# Patient Record
Sex: Male | Born: 1995 | Race: White | Hispanic: No | Marital: Single | Smoking: Never smoker
Health system: Southern US, Community
[De-identification: ages and names within clinical notes are randomized; demographics above are authoritative.]

## PROBLEM LIST (undated history)

## (undated) DIAGNOSIS — J45909 Unspecified asthma, uncomplicated: Secondary | ICD-10-CM

## (undated) HISTORY — DX: Unspecified asthma, uncomplicated: J45.909

## (undated) HISTORY — PX: NO PAST SURGERIES: SHX2092

---
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
## 2017-09-20 ENCOUNTER — Other Ambulatory Visit: Payer: Self-pay

## 2017-09-20 ENCOUNTER — Emergency Department
Admission: EM | Admit: 2017-09-20 | Discharge: 2017-09-20 | Disposition: A | Discharge status: Home / Self Care | Payer: Managed Care, Other (non HMO) | Attending: Emergency Medicine | Admitting: Emergency Medicine

## 2017-09-20 ENCOUNTER — Encounter: Payer: Self-pay | Admitting: Emergency Medicine

## 2017-09-20 DIAGNOSIS — T7840XA Allergy, unspecified, initial encounter: Secondary | ICD-10-CM | POA: Diagnosis not present

## 2017-09-20 DIAGNOSIS — Z91010 Allergy to peanuts: Secondary | ICD-10-CM | POA: Insufficient documentation

## 2017-09-20 DIAGNOSIS — Z91018 Allergy to other foods: Secondary | ICD-10-CM

## 2017-09-20 DIAGNOSIS — R0989 Other specified symptoms and signs involving the circulatory and respiratory systems: Secondary | ICD-10-CM | POA: Diagnosis present

## 2017-09-20 MED ORDER — PREDNISONE 20 MG PO TABS: 40.0000 mg | ORAL_TABLET | Freq: Every day | ORAL | 0 refills | Status: DC

## 2017-09-20 MED ORDER — PREDNISONE 20 MG PO TABS
40.0000 mg | ORAL_TABLET | Freq: Once | ORAL | Status: AC
  Administered 2017-09-20: 40 mg via ORAL
  Filled 2017-09-20: qty 2

## 2017-09-20 MED ORDER — FAMOTIDINE 20 MG PO TABS
20.0000 mg | ORAL_TABLET | Freq: Once | ORAL | Status: AC
  Administered 2017-09-20: 20 mg via ORAL
  Filled 2017-09-20: qty 1

## 2017-09-20 MED ORDER — DIPHENHYDRAMINE HCL 25 MG PO CAPS
25.0000 mg | ORAL_CAPSULE | Freq: Once | ORAL | Status: AC
  Administered 2017-09-20: 25 mg via ORAL
  Filled 2017-09-20: qty 1

## 2017-09-20 MED ORDER — EPINEPHRINE 0.3 MG/0.3ML IJ SOAJ
0.3000 mg | Freq: Once | INTRAMUSCULAR | 0 refills | Status: AC
Start: 2017-09-20 — End: 2017-09-20

## 2017-09-20 NOTE — ED Triage Notes (Signed)
Patient has history of peanut allergy.  About one hour ago, patient ate banana pudding with macadamia nuts.  C/O feeling like throat has cotton in it..  25 mg Benadryl taken PO.  AAOx3.  Skin warm and dry.  NAD.

## 2017-09-20 NOTE — Discharge Instructions (Addendum)

## 2017-09-20 NOTE — ED Provider Notes (Signed)
[facility] Emergency Department Provider Note   ____________________________________________   First MD Initiated Contact with Patient 09/20/17 1414     (approximate)  I have reviewed the triage vital signs and the nursing notes.   HISTORY  Chief Complaint Allergic Reaction   HPI Jared Sims is a 22 y.o. male reports no major medical history is a history of asthma and not in tree not allergy  Patient was eating pudding today, but 15 minutes after eating he began to experience a little bit of a "cotton ball feeling in the back of his throat and learned that the putting actually had macadamia nuts in it.  He then felt little nauseated after taking the trash out but those symptoms have gone away except he had a slight feeling cottonball or slight swelling feeling in the back of his throat.  He went to the pharmacy, got a Benadryl and took 125 mg tablet.  He reports he still having just a slight sensation in the back of his throat but is feeling overall better.  No rash no itching.  No trouble breathing.  No trouble swallowing, just feels like a little bit of "cottony" feeling.  No nausea vomiting.  No fevers or chills.  Had some nausea immediately after around 02/25/1928, but is feeling better now.  Last time he an allergy reaction he was many years ago, does not currently have EpiPen prescription.  History reviewed. No pertinent past medical history.  There are no active problems to display for this patient.   History reviewed. No pertinent surgical history.  Prior to Admission medications   Medication Sig Start Date End Date Taking? Authorizing Provider  EPINEPHrine 0.3 mg/0.3 mL IJ SOAJ injection Inject 0.3 mLs (0.3 mg total) into the muscle once for 1 dose. 09/20/17 09/20/17  Sharyn Creamer, MD  predniSONE (DELTASONE) 20 MG tablet Take 2 tablets (40 mg total) by mouth daily with breakfast. 09/20/17   Sharyn Creamer, MD    Allergies Peanut-containing  drug products  No family history on file.  Social History Social History   Tobacco Use  . Smoking status: Never Smoker  . Smokeless tobacco: Never Used  Substance Use Topics  . Alcohol use: Not on file  . Drug use: Not on file    Review of Systems Constitutional: No fever/chills Eyes: No visual changes. ENT: No sore throat.  See HPI Cardiovascular: Denies chest pain. Respiratory: Denies shortness of breath. Gastrointestinal: No abdominal pain.   Skin: Negative for rash. Neurological: Negative for headaches.    ____________________________________________   PHYSICAL EXAM:  VITAL SIGNS: ED Triage Vitals  Enc Vitals Group     BP 09/20/17 1409 135/62     Pulse Rate 09/20/17 1409 (!) 51     Resp 09/20/17 1409 18     Temp 09/20/17 1409 98 F (36.7 C)     Temp Source 09/20/17 1409 Oral     SpO2 09/20/17 1409 100 %     Weight 09/20/17 1405 160 lb (72.6 kg)     Height 09/20/17 1405 5\' 10"  (1.778 m)     Head Circumference --      Peak Flow --      Pain Score 09/20/17 1405 0     Pain Loc --      Pain Edu? --      Excl. in GC? --     Constitutional: Alert and oriented. Well appearing and in no acute distress. Eyes: Conjunctivae are normal. Head: Atraumatic.  Nose: No congestion/rhinnorhea. Mouth/Throat: Mucous membranes are moist. Neck: No stridor.  The visualizable oropharynx is widely patent.  No edema.  Normal tonsils.  No edema, no elevation of the tongue.  Anterior neck is normal.  Speaks with full clear sentences.  Swallowing normally. Cardiovascular: Normal rate, regular rhythm. Grossly normal heart sounds.  Good peripheral circulation. Respiratory: Normal respiratory effort.  No retractions. Lungs CTAB. Gastrointestinal: Soft and nontender. No distention. Musculoskeletal: No lower extremity tenderness nor edema. Neurologic:  Normal speech and language. No gross focal neurologic deficits are appreciated.  Skin:  Skin is warm, dry and intact. No rash  noted. Psychiatric: Mood and affect are normal. Speech and behavior are normal.  ____________________________________________   LABS (all labs ordered are listed, but only abnormal results are displayed)  Labs Reviewed - No data to display ____________________________________________  EKG   ____________________________________________  RADIOLOGY   ____________________________________________   PROCEDURES  Procedure(s) performed: None  Procedures  Critical Care performed: No  ____________________________________________   INITIAL IMPRESSION / ASSESSMENT AND PLAN / ED COURSE  Pertinent labs & imaging results that were available during my care of the patient were reviewed by me and considered in my medical decision making (see chart for details).  Exposure to not with a known not allergy.  Currently appears quite well without any evidence of angioedema but does report a slight cottony feeling in the back of the throat.  Speaks with clear normal voice, and took Benadryl which I suspect is helped some already.  No evidence of anaphylaxis to noted, no angioedema denoted.  Will observe the patient and treat with additional Benadryl and Pepcid, also prednisone.  Discussed with patient anticipate discharging with prescription for EpiPen after observation of continue to do so well.    ----------------------------------------- 3:12 PM on 09/20/2017 -----------------------------------------  Patient reports he is feeling improved, no symptoms at present.  Resting comfortably.  Will discharge to home, provided teaching on return precautions and epinephrine pen usage patient.  Mother will be driving him home.  Return precautions and treatment recommendations and follow-up discussed with the patient who is agreeable with the plan.   ____________________________________________   FINAL CLINICAL IMPRESSION(S) / ED DIAGNOSES  Final diagnoses:  Allergic reaction, initial  encounter  Tree nut allergy      NEW MEDICATIONS STARTED DURING THIS VISIT:  New Prescriptions   EPINEPHRINE 0.3 MG/0.3 ML IJ SOAJ INJECTION    Inject 0.3 mLs (0.3 mg total) into the muscle once for 1 dose.   PREDNISONE (DELTASONE) 20 MG TABLET    Take 2 tablets (40 mg total) by mouth daily with breakfast.     Note:  This document was prepared using Dragon voice recognition software and may include unintentional dictation errors.     Sharyn CreamerQuale, Olinda Nola, MD 09/20/17 1515

## 2018-10-27 ENCOUNTER — Ambulatory Visit (INDEPENDENT_AMBULATORY_CARE_PROVIDER_SITE_OTHER): Payer: Managed Care, Other (non HMO) | Admitting: Family Medicine

## 2018-10-27 ENCOUNTER — Other Ambulatory Visit: Payer: Self-pay

## 2018-10-27 ENCOUNTER — Encounter: Payer: Self-pay | Admitting: Family Medicine

## 2018-10-27 VITALS — BP 98/60 | HR 97 | Temp 98.0°F | Ht 69.25 in | Wt 152.8 lb

## 2018-10-27 DIAGNOSIS — Z0000 Encounter for general adult medical examination without abnormal findings: Secondary | ICD-10-CM

## 2018-10-27 DIAGNOSIS — Z91010 Allergy to peanuts: Secondary | ICD-10-CM

## 2018-10-27 MED ORDER — EPINEPHRINE 0.3 MG/0.3ML IJ SOAJ
0.3000 mg | INTRAMUSCULAR | 1 refills | Status: AC | PRN
Start: 2018-10-27 — End: ?

## 2018-10-27 NOTE — Patient Instructions (Signed)
Good to meet you today  Keep up your healthy habits- exercise, sleep, low stress Add stretching, especially after running  Follow up every 1-2 years for your physical, come in if you need to for illness/injury/etc  Health Maintenance, Male Adopting a healthy lifestyle and getting preventive care are important in promoting health and wellness. Ask your health care provider about:  The right schedule for you to have regular tests and exams.  Things you can do on your own to prevent diseases and keep yourself healthy. What should I know about diet, weight, and exercise? Eat a healthy diet   Eat a diet that includes plenty of vegetables, fruits, low-fat dairy products, and lean protein.  Do not eat a lot of foods that are high in solid fats, added sugars, or sodium. Maintain a healthy weight Body mass index (BMI) is a measurement that can be used to identify possible weight problems. It estimates body fat based on height and weight. Your health care provider can help determine your BMI and help you achieve or maintain a healthy weight. Get regular exercise Get regular exercise. This is one of the most important things you can do for your health. Most adults should:  Exercise for at least 150 minutes each week. The exercise should increase your heart rate and make you sweat (moderate-intensity exercise).  Do strengthening exercises at least twice a week. This is in addition to the moderate-intensity exercise.  Spend less time sitting. Even light physical activity can be beneficial. Watch cholesterol and blood lipids Have your blood tested for lipids and cholesterol at 22 years of age, then have this test every 5 years. You may need to have your cholesterol levels checked more often if:  Your lipid or cholesterol levels are high.  You are older than 22 years of age.  You are at high risk for heart disease. What should I know about cancer screening? Many types of cancers can be  detected early and may often be prevented. Depending on your health history and family history, you may need to have cancer screening at various ages. This may include screening for:  Colorectal cancer.  Prostate cancer.  Skin cancer.  Lung cancer. What should I know about heart disease, diabetes, and high blood pressure? Blood pressure and heart disease  High blood pressure causes heart disease and increases the risk of stroke. This is more likely to develop in people who have high blood pressure readings, are of African descent, or are overweight.  Talk with your health care provider about your target blood pressure readings.  Have your blood pressure checked: ? Every 3-5 years if you are 7818-22 years of age. ? Every year if you are 327 years old or older.  If you are between the ages of 4765 and 1275 and are a current or former smoker, ask your health care provider if you should have a one-time screening for abdominal aortic aneurysm (AAA). Diabetes Have regular diabetes screenings. This checks your fasting blood sugar level. Have the screening done:  Once every three years after age 22 if you are at a normal weight and have a low risk for diabetes.  More often and at a younger age if you are overweight or have a high risk for diabetes. What should I know about preventing infection? Hepatitis B If you have a higher risk for hepatitis B, you should be screened for this virus. Talk with your health care provider to find out if you are at risk  for hepatitis B infection. Hepatitis C Blood testing is recommended for:  Everyone born from [birth year] through 1964.  Anyone with known risk factors for hepatitis C. Sexually transmitted infections (STIs)  You should be screened each year for STIs, including gonorrhea and chlamydia, if: ? You are sexually active and are younger than 22 years of age. ? You are older than 23 years of age and your health care provider tells you that you are at risk  for this type of infection. ? Your sexual activity has changed since you were last screened, and you are at increased risk for chlamydia or gonorrhea. Ask your health care provider if you are at risk.  Ask your health care provider about whether you are at high risk for HIV. Your health care provider may recommend a prescription medicine to help prevent HIV infection. If you choose to take medicine to prevent HIV, you should first get tested for HIV. You should then be tested every 3 months for as long as you are taking the medicine. Follow these instructions at home: Lifestyle  Do not use any products that contain nicotine or tobacco, such as cigarettes, e-cigarettes, and chewing tobacco. If you need help quitting, ask your health care provider.  Do not use street drugs.  Do not share needles.  Ask your health care provider for help if you need support or information about quitting drugs. Alcohol use  Do not drink alcohol if your health care provider tells you not to drink.  If you drink alcohol: ? Limit how much you have to 0-2 drinks a day. ? Be aware of how much alcohol is in your drink. In the [place] one drink equals one 12 oz bottle of beer (355 mL), one 5 oz glass of wine (148 mL), or one 1 oz glass of hard liquor (44 mL). General instructions  Schedule regular health, dental, and eye exams.  Stay current with your vaccines.  Tell your health care provider if: ? You often feel depressed. ? You have ever been abused or do not feel safe at home. Summary  Adopting a healthy lifestyle and getting preventive care are important in promoting health and wellness.  Follow your health care provider's instructions about healthy diet, exercising, and getting tested or screened for diseases.  Follow your health care provider's instructions on monitoring your cholesterol and blood pressure. This information is not intended to replace advice given to you by your health care provider.  Make sure you discuss any questions you have with your health care provider. Document Released: 08/09/2007 Document Revised: 02/03/2018 Document Reviewed: 02/03/2018 Elsevier Patient Education  2020 [employer].

## 2018-10-27 NOTE — Progress Notes (Signed)
Subjective:    Patient ID: Jared Sims, male    DOB: 1995-05-31, 23 y.o.   MRN: [id number]  HPI Chief Complaint  Patient presents with  . New Patient (Initial Visit)    Establish Care   This is a 23 yo male who presents today to establish care. He lives with his parents and two younger sisters. 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 He enjoys hunting, fishing, running.     Last CPE- several years ago Tdap- will check immunization records, ? Had in HS Flu-declines Dental- regular Eye- regular Exercise- runs daily Sex- never sexually active ETOH- never Drugs- never   Past Medical History:  Diagnosis Date  . Childhood asthma    Past Surgical History:  Procedure Laterality Date  . NO PAST SURGERIES     Family History  Problem Relation Age of Onset  . Cancer Maternal Grandmother   . Early death Maternal Grandmother   . Asthma Maternal Grandfather   . Kidney disease Paternal Grandmother   . Early death Paternal Grandfather    Social History   Tobacco Use  . Smoking status: Never Smoker  . Smokeless tobacco: Never Used  Substance Use Topics  . Alcohol use: Never    Frequency: Never  . Drug use: Never      Review of Systems  Constitutional: Negative.   HENT: Negative.   Eyes: Negative.   Respiratory: Negative.   Cardiovascular: Negative.   Gastrointestinal: Negative.   Endocrine: Negative.   Genitourinary: Negative.   Musculoskeletal: Negative.   Skin: Negative.   Allergic/Immunologic: Positive for food allergies (peanut, carries epi pen, avoids triggers).  Neurological: Negative.   Hematological: Negative.   Psychiatric/Behavioral: Negative.        Objective:   Physical Exam Physical Exam  Constitutional: He is oriented to person, place, and time. He appears well-developed and well-nourished.  HENT:  Head: Normocephalic and atraumatic.  Right Ear: External ear normal.  Left Ear: External ear normal.  Nose: Nose normal.    Eyes: Conjunctivae are normal.Neck: Normal range of motion. Neck supple.  Cardiovascular: Normal rate, regular rhythm, normal heart sounds and intact distal pulses.   Pulmonary/Chest: Effort normal and breath sounds normal.  Abdominal: Soft. Bowel sounds are normal. Hernia confirmed negative in the right inguinal area and confirmed negative in the left inguinal area.  Genitourinary: Testes normal and penis normal. Circumcised.  Musculoskeletal: Normal range of motion. He exhibits no edema or tenderness.       Cervical back: Normal.       Thoracic back: Normal.       Lumbar back: Normal.  Lymphadenopathy:    He has no cervical adenopathy.       Right: No inguinal adenopathy present.       Left: No inguinal adenopathy present.  Neurological: He is alert and oriented to person, place, and time. He has normal reflexes.  Skin: Skin is warm and dry.  Psychiatric: He has a normal mood and affect. His behavior is normal. Judgment normal.  Vitals reviewed.    BP 98/60 (BP Location: Left Arm, Patient Position: Sitting, Cuff Size: Normal)   Pulse 97   Temp 98 F (36.7 C) (Temporal)   Ht 5' 9.25" (1.759 m)   Wt 152 lb 12.8 oz (69.3 kg)   SpO2 97%   BMI 22.40 kg/m  Depression screen [facility] 2/9 10/27/2018  Decreased Interest 0  Down, Depressed, Hopeless 0  PHQ - 2 Score 0  Assessment & Plan:  1. Annual physical exam - Discussed and encouraged healthy lifestyle choices- adequate sleep, regular exercise, stress management and healthy food choices.    2. Peanut allergy - EPINEPHrine 0.3 mg/0.3 mL IJ SOAJ injection; Inject 0.3 mLs (0.3 mg total) into the muscle as needed for anaphylaxis.  Dispense: 1 each; Refill: 1   Olean Reeeborah Gessner, FNP-BC  [facility]  10/28/2018 7:10 AM

## 2018-10-28 ENCOUNTER — Encounter: Payer: Self-pay | Admitting: Family Medicine

## 2022-02-19 ENCOUNTER — Encounter: Payer: Self-pay | Admitting: Family Medicine

## 2022-02-19 ENCOUNTER — Ambulatory Visit (INDEPENDENT_AMBULATORY_CARE_PROVIDER_SITE_OTHER): Payer: Managed Care, Other (non HMO) | Admitting: Family Medicine

## 2022-02-19 VITALS — BP 130/60 | HR 56 | Temp 98.5°F | Ht 71.0 in | Wt 139.0 lb

## 2022-02-19 DIAGNOSIS — Z0000 Encounter for general adult medical examination without abnormal findings: Secondary | ICD-10-CM

## 2022-02-19 NOTE — Assessment & Plan Note (Signed)

## 2022-02-19 NOTE — Patient Instructions (Signed)
It was great to meet you today and I'm excited to have you join the [facility] practice. I hope you had a positive experience today! If you feel so inclined, please feel free to recommend our practice to friends and family. Jaleesa Cervi, FNP-C  

## 2022-02-19 NOTE — Progress Notes (Signed)
New Patient Office Visit  Subjective    Patient ID: Jared Sims, male    DOB: 12/30/1995  Age: 26 y.o. MRN: [id number]  CC:  Chief Complaint  Patient presents with   Establish Care    HPI Jared Sims presents to establish care. Oriented to practice routines and expectations. Denies concerns. Consumes a regular diet with plenty of fruits and vegetables and has no exercise routine.  Declines STD Denies tobacco, ETOH drug use   Outpatient Encounter Medications as of 02/19/2022  Medication Sig   EPINEPHrine 0.3 mg/0.3 mL IJ SOAJ injection Inject 0.3 mLs (0.3 mg total) into the muscle as needed for anaphylaxis.   [DISCONTINUED] vitamin C (ASCORBIC ACID) 500 MG tablet Take 500 mg by mouth daily. (Patient not taking: Reported on 02/19/2022)   No facility-administered encounter medications on file as of 02/19/2022.    Past Medical History:  Diagnosis Date   Childhood asthma     Past Surgical History:  Procedure Laterality Date   NO PAST SURGERIES      Family History  Problem Relation Age of Onset   Cancer Maternal Grandmother    Early death Maternal Grandmother    Asthma Maternal Grandfather    Kidney disease Paternal Grandmother    Early death Paternal Grandfather     Social History   Socioeconomic History   Marital status: Single    Spouse name: Not on file   Number of children: Not on file   Years of education: Not on file   Highest education level: Not on file  Occupational History   Occupation: full time  Tobacco Use   Smoking status: Never   Smokeless tobacco: Never  Vaping Use   Vaping Use: Never used  Substance and Sexual Activity   Alcohol use: Never   Drug use: Never   Sexual activity: Not on file  Other Topics Concern   Not on file  Social History Narrative   Not on file   Social Determinants of Health   Financial Resource Strain: Not on file  Food Insecurity: Not on file  Transportation Needs: Not on file  Physical  Activity: Not on file  Stress: Not on file  Social Connections: Not on file  Intimate Partner Violence: Not on file    Review of Systems  Constitutional: Negative.   HENT: Negative.    Eyes: Negative.   Respiratory: Negative.    Cardiovascular: Negative.   Gastrointestinal: Negative.   Genitourinary: Negative.   Musculoskeletal: Negative.   Skin: Negative.   Neurological: Negative.   Endo/Heme/Allergies: Negative.   Psychiatric/Behavioral: Negative.    All other systems reviewed and are negative.       Objective    BP 130/60   Pulse (!) 56   Temp 98.5 F (36.9 C) (Oral)   Ht 5\' 11"  (1.803 m)   Wt 139 lb (63 kg)   SpO2 96%   BMI 19.39 kg/m   Physical Exam Vitals and nursing note reviewed.  Constitutional:      Appearance: Normal appearance. He is normal weight.  HENT:     Head: Normocephalic and atraumatic.     Right Ear: Tympanic membrane, ear canal and external ear normal.     Left Ear: Tympanic membrane, ear canal and external ear normal.     Nose: Nose normal.     Mouth/Throat:     Mouth: Mucous membranes are moist.     Pharynx: Oropharynx is clear.  Eyes:  Extraocular Movements: Extraocular movements intact.     Right eye: Normal extraocular motion and no nystagmus.     Left eye: Normal extraocular motion and no nystagmus.     Conjunctiva/sclera: Conjunctivae normal.     Pupils: Pupils are equal, round, and reactive to light.  Cardiovascular:     Rate and Rhythm: Normal rate and regular rhythm.     Pulses: Normal pulses.     Heart sounds: Normal heart sounds.  Pulmonary:     Effort: Pulmonary effort is normal.     Breath sounds: Normal breath sounds.  Abdominal:     General: Bowel sounds are normal.     Palpations: Abdomen is soft.  Genitourinary:    Comments: Deferred using shared decision making Musculoskeletal:        General: Normal range of motion.     Cervical back: Normal range of motion and neck supple.  Skin:    General: Skin is  warm and dry.     Capillary Refill: Capillary refill takes less than 2 seconds.  Neurological:     General: No focal deficit present.     Mental Status: He is alert. Mental status is at baseline.  Psychiatric:        Mood and Affect: Mood normal.        Speech: Speech normal.        Behavior: Behavior normal.        Thought Content: Thought content normal.        Cognition and Memory: Cognition and memory normal.        Judgment: Judgment normal.         Assessment & Plan:   Problem List Items Addressed This Visit       Other   Physical exam, annual - Primary    Today your medical history was reviewed and routine physical exam with labs was performed. Recommend 150 minutes of moderate intensity exercise weekly and consuming a well-balanced diet. Advised to stop smoking if a smoker, avoid smoking if a non-smoker, limit alcohol consumption to 1 drink per day for women and 2 drinks per day for men, and avoid illicit drug use. Counseled on safe sex practices and offered STI testing today. Counseled on the importance of sunscreen use. Counseled in mental health awareness and when to seek medical care. Vaccine maintenance discussed. Appropriate health maintenance items reviewed. Return to office in 1 year for annual physical exam.       Relevant Orders   CBC with Differential/Platelet   COMPLETE METABOLIC PANEL WITH GFR   Lipid panel    Return in about 1 year (around 02/20/2023) for annual physical.   Park Meo, FNP

## 2022-02-25 ENCOUNTER — Telehealth: Payer: Self-pay

## 2022-02-25 NOTE — Telephone Encounter (Signed)
Spoke to [employer] re pt records. There is no immunization records for pt and current labs/visits is in epic

## 2022-07-11 ENCOUNTER — Other Ambulatory Visit: Payer: Self-pay

## 2022-07-11 ENCOUNTER — Emergency Department: Payer: 59

## 2022-07-11 DIAGNOSIS — S060X0A Concussion without loss of consciousness, initial encounter: Secondary | ICD-10-CM | POA: Insufficient documentation

## 2022-07-11 DIAGNOSIS — Y9365 Activity, lacrosse and field hockey: Secondary | ICD-10-CM | POA: Insufficient documentation

## 2022-07-11 DIAGNOSIS — W500XXA Accidental hit or strike by another person, initial encounter: Secondary | ICD-10-CM | POA: Insufficient documentation

## 2022-07-11 DIAGNOSIS — M542 Cervicalgia: Secondary | ICD-10-CM | POA: Diagnosis not present

## 2022-07-11 DIAGNOSIS — S0990XA Unspecified injury of head, initial encounter: Secondary | ICD-10-CM | POA: Diagnosis present

## 2022-07-11 NOTE — ED Triage Notes (Signed)
Pt was playing lacross and hit his head with another player. Per mom pt has been vomiting. Pt is tearful and slow to answer questions.

## 2022-07-12 ENCOUNTER — Emergency Department
Admission: EM | Admit: 2022-07-12 | Discharge: 2022-07-12 | Disposition: A | Discharge status: Home / Self Care | Payer: 59 | Attending: Emergency Medicine | Admitting: Emergency Medicine

## 2022-07-12 ENCOUNTER — Emergency Department: Payer: 59

## 2022-07-12 DIAGNOSIS — S060X0A Concussion without loss of consciousness, initial encounter: Secondary | ICD-10-CM

## 2022-07-12 DIAGNOSIS — Y9365 Activity, lacrosse and field hockey: Secondary | ICD-10-CM

## 2022-07-12 MED ORDER — PROCHLORPERAZINE MALEATE 10 MG PO TABS: 10.0000 mg | ORAL_TABLET | Freq: Four times a day (QID) | ORAL | 0 refills | Status: AC | PRN

## 2022-07-12 NOTE — ED Provider Notes (Signed)
[facility] Provider Note    Event Date/Time   First MD Initiated Contact with Patient 07/12/22 0101     (approximate)   History   Chief Complaint Head Injury   HPI  Jared Sims is a 27 y.o. male with no significant past medical history who presents to the ED complaining of head injury.  Patient reports that earlier this evening around 5 PM he was playing lacrosse when he turned around quickly while running down the field, subsequently was struck in the head by another player.  He reports being "dazed," but did not lose consciousness.  He has continued to feel disoriented with headache and neck pain since the injury.  He has felt nauseous and has vomited a couple of times, denies significant ongoing nausea.  He reports being sensitive to light but has not had any vision changes, speech changes, numbness, or weakness.  He denies any injuries to his chest, abdomen, or extremities.     Physical Exam   Triage Vital Signs: ED Triage Vitals [07/11/22 2216]  Enc Vitals Group     BP 135/87     Pulse Rate (!) 105     Resp 16     Temp 98.3 F (36.8 C)     Temp Source Oral     SpO2 97 %     Weight      Height      Head Circumference      Peak Flow      Pain Score 8     Pain Loc      Pain Edu?      Excl. in GC?     Most recent vital signs: Vitals:   07/11/22 2216  BP: 135/87  Pulse: (!) 105  Resp: 16  Temp: 98.3 F (36.8 C)  SpO2: 97%    Constitutional: Alert and oriented. Eyes: Conjunctivae are normal. Head: No scalp hematomas or step-offs. Nose: No congestion/rhinnorhea. Mouth/Throat: Mucous membranes are moist.  Neck: Midline cervical spine tenderness to palpation noted. Cardiovascular: Normal rate, regular rhythm. Grossly normal heart sounds.  2+ radial pulses bilaterally. Respiratory: Normal respiratory effort.  No retractions. Lungs CTAB.  No chest wall tenderness to palpation. Gastrointestinal: Soft and nontender. No  distention. Musculoskeletal: No lower extremity tenderness nor edema.  No upper extremity bony tenderness to palpation. Neurologic:  Normal speech and language. No gross focal neurologic deficits are appreciated.    ED Results / Procedures / Treatments   Labs (all labs ordered are listed, but only abnormal results are displayed) Labs Reviewed - No data to display  RADIOLOGY CT head reviewed and interpreted by me with no hemorrhage or midline shift.  PROCEDURES:  Critical Care performed: No  Procedures   MEDICATIONS ORDERED IN ED: Medications - No data to display   IMPRESSION / MDM / ASSESSMENT AND PLAN / ED COURSE  I reviewed the triage vital signs and the nursing notes.                              27 y.o. male with no significant past medical history presents to the ED with head injury suffered while playing lacrosse, now complains of headache, neck pain, nausea, vomiting, and dizziness.  Patient's presentation is most consistent with acute presentation with potential threat to life or bodily function.  Differential diagnosis includes, but is not limited to, intracranial hemorrhage, concussion, cervical spine injury.  Patient nontoxic-appearing and  in no acute distress, vital signs remarkable for tachycardia but otherwise reassuring.  He is awake and alert with no focal neurologic deficits on exam.  CT head performed from triage is negative for acute process.  He does have some midline cervical spine tenderness and we will further assess with CT of his cervical spine.  Otherwise, his symptoms seem most consistent with concussion.  CT cervical spine is negative for acute process.  Patient is appropriate for discharge home with neurology follow-up as needed for suspected concussion.  He was prescribed Compazine for symptomatic management, counseled to return to the ED for new or worsening symptoms.  Patient and parents agree with plan.      FINAL CLINICAL IMPRESSION(S) /  ED DIAGNOSES   Final diagnoses:  Injury while playing lacrosse  Concussion without loss of consciousness, initial encounter     Rx / DC Orders   ED Discharge Orders          Ordered    prochlorperazine (COMPAZINE) 10 MG tablet  Every 6 hours PRN        07/12/22 0244             Note:  This document was prepared using Dragon voice recognition software and may include unintentional dictation errors.   Chesley Noon, MD 07/12/22 [phone]

## 2022-07-15 ENCOUNTER — Telehealth: Payer: Self-pay

## 2022-07-15 NOTE — Transitions of Care (Post Inpatient/ED Visit) (Signed)
   07/15/2022  Name: KOEHN HARROW MRN: [id number] DOB: [birth year]  Today's TOC FU Call Status: Today's TOC FU Call Status:: Unsuccessul Call (1st Attempt) Unsuccessful Call (1st Attempt) Date: 07/15/22  Attempted to reach the patient regarding the most recent Inpatient/ED visit.  Follow Up Plan: Additional outreach attempts will be made to reach the patient to complete the Transitions of Care (Post Inpatient/ED visit) call.   Signature Karena Addison, LPN [facility] Nurse Health Advisor Direct Dial [phone]

## 2022-07-16 NOTE — Transitions of Care (Post Inpatient/ED Visit) (Unsigned)
   07/16/2022  Name: Jared Sims MRN: [id number] DOB: October 24, 1995  Today's TOC FU Call Status: Today's TOC FU Call Status:: Unsuccessful Call (2nd Attempt) Unsuccessful Call (1st Attempt) Date: 07/15/22 Unsuccessful Call (2nd Attempt) Date: 07/16/22  Attempted to reach the patient regarding the most recent Inpatient/ED visit.  Follow Up Plan: Additional outreach attempts will be made to reach the patient to complete the Transitions of Care (Post Inpatient/ED visit) call.   Signature Karena Addison, LPN [facility] Nurse Health Advisor Direct Dial [phone]

## 2022-07-17 NOTE — Transitions of Care (Post Inpatient/ED Visit) (Signed)
   07/17/2022  Name: Jared Sims MRN: [id number] DOB: [birth year]  Today's TOC FU Call Status: Today's TOC FU Call Status:: Successful TOC FU Call Competed Unsuccessful Call (1st Attempt) Date: 07/15/22 Unsuccessful Call (2nd Attempt) Date: 07/16/22 [facility] FU Call Complete Date: 07/17/22  Transition Care Management Follow-up Telephone Call Date of Discharge: 07/12/22 Discharge Facility: [facility]) Type of Discharge: Inpatient Admission Primary Inpatient Discharge Diagnosis:: concussion Reason for ED Visit: Other: (concussion) How have you been since you were released from the hospital?: Better Any questions or concerns?: No  Items Reviewed: Did you receive and understand the discharge instructions provided?: Yes Medications obtained,verified, and reconciled?: Yes (Medications Reviewed) Any new allergies since your discharge?: No Dietary orders reviewed?: Yes Do you have support at home?: Yes People in Home: spouse  Medications Reviewed Today: Medications Reviewed Today     Reviewed by Karena Addison, LPN (Licensed Practical Nurse) on 07/17/22 at 1442  Med List Status: <None>   Medication Order Taking? Sig Documenting Provider Last Dose Status Informant  EPINEPHrine 0.3 mg/0.3 mL IJ SOAJ injection [id number] No Inject 0.3 mLs (0.3 mg total) into the muscle as needed for anaphylaxis. Emi Belfast, FNP Taking Active   prochlorperazine (COMPAZINE) 10 MG tablet [id number]  Take 1 tablet (10 mg total) by mouth every 6 (six) hours as needed. Chesley Noon, MD  Active             Home Care and Equipment/Supplies: Were Home Health Services Ordered?: NA Any new equipment or medical supplies ordered?: NA  Functional Questionnaire: Do you need assistance with bathing/showering or dressing?: No Do you need assistance with meal preparation?: No Do you need assistance with eating?: No Do you have difficulty maintaining continence: No Do you  need assistance with getting out of bed/getting out of a chair/moving?: No Do you have difficulty managing or taking your medications?: No  Follow up appointments reviewed: PCP Follow-up appointment confirmed?: No (declined) MD Provider Line Number:[phone] Given: No Specialist Hospital Follow-up appointment confirmed?: NA Do you need transportation to your follow-up appointment?: No Do you understand care options if your condition(s) worsen?: Yes-patient verbalized understanding    SIGNATURE Karena Addison, LPN [facility] Nurse Health Advisor Direct Dial [phone]

## 2023-02-23 ENCOUNTER — Ambulatory Visit (INDEPENDENT_AMBULATORY_CARE_PROVIDER_SITE_OTHER): Payer: BC Managed Care – PPO | Admitting: Family Medicine

## 2023-02-23 ENCOUNTER — Encounter: Payer: Self-pay | Admitting: Family Medicine

## 2023-02-23 VITALS — BP 120/62 | HR 47 | Temp 98.2°F | Ht 71.0 in | Wt 159.0 lb

## 2023-02-23 DIAGNOSIS — Z0000 Encounter for general adult medical examination without abnormal findings: Secondary | ICD-10-CM | POA: Diagnosis not present

## 2023-02-23 NOTE — Progress Notes (Signed)
Complete physical exam  Patient: Jared Sims   DOB: Apr 13, 1995   27 y.o. Male  MRN: [id number]  Subjective:    Chief Complaint  Patient presents with   Follow-up    cpe and fasting labs (last cpe 04/22/21) - JBG\\\     Jared Sims is a 27 y.o. male who presents today for a complete physical exam. He reports consuming a  high protein  diet. Gym/ health club routine includes mod to heavy weightlifting. He generally feels well. He reports sleeping well. He does not have additional problems to discuss today.    Most recent fall risk assessment:    02/19/2022    3:13 PM  Fall Risk   Falls in the past year? 0  Number falls in past yr: 0  Injury with Fall? 0     Most recent depression screenings:    02/23/2023   10:45 AM 02/19/2022    3:12 PM  PHQ 2/9 Scores  PHQ - 2 Score 0 0  PHQ- 9 Score 0 1    Dental: No current dental problems and Receives regular dental care and STD: The patient denies history of sexually transmitted disease.  Patient Active Problem List   Diagnosis Date Noted   Physical exam, annual 02/19/2022   Past Medical History:  Diagnosis Date   Childhood asthma    Past Surgical History:  Procedure Laterality Date   NO PAST SURGERIES     Social History   Tobacco Use   Smoking status: Never   Smokeless tobacco: Never  Vaping Use   Vaping status: Never Used  Substance Use Topics   Alcohol use: Never   Drug use: Never   Family History  Problem Relation Age of Onset   Cancer Maternal Grandmother    Early death Maternal Grandmother    Asthma Maternal Grandfather    Kidney disease Paternal Grandmother    Early death Paternal Grandfather    Allergies  Allergen Reactions   Peanut-Containing Drug Products       Patient Care Team: Park Meo, FNP as PCP - General (Family Medicine)   Outpatient Medications Prior to Visit  Medication Sig   EPINEPHrine 0.3 mg/0.3 mL IJ SOAJ injection Inject 0.3 mLs (0.3 mg total) into the  muscle as needed for anaphylaxis.   prochlorperazine (COMPAZINE) 10 MG tablet Take 1 tablet (10 mg total) by mouth every 6 (six) hours as needed. (Patient not taking: Reported on 02/23/2023)   No facility-administered medications prior to visit.    Review of Systems  Constitutional: Negative.   HENT: Negative.    Eyes: Negative.   Respiratory: Negative.    Cardiovascular: Negative.   Gastrointestinal: Negative.   Genitourinary: Negative.   Musculoskeletal: Negative.   Skin: Negative.   Neurological: Negative.   Endo/Heme/Allergies: Negative.   Psychiatric/Behavioral: Negative.    All other systems reviewed and are negative.         Objective:     BP 120/62   Pulse (!) 47   Temp 98.2 F (36.8 C) (Oral)   Ht 5\' 11"  (1.803 m)   Wt 159 lb (72.1 kg)   SpO2 97%   BMI 22.18 kg/m  BP Readings from Last 3 Encounters:  02/23/23 120/62  07/12/22 132/82  02/19/22 130/60   Wt Readings from Last 3 Encounters:  02/23/23 159 lb (72.1 kg)  02/19/22 139 lb (63 kg)  10/27/18 152 lb 12.8 oz (69.3 kg)      Physical Exam  Vitals and nursing note reviewed.  Constitutional:      Appearance: Normal appearance. He is normal weight.  HENT:     Head: Normocephalic and atraumatic.     Right Ear: Tympanic membrane, ear canal and external ear normal.     Left Ear: Tympanic membrane, ear canal and external ear normal.     Nose: Nose normal.     Mouth/Throat:     Mouth: Mucous membranes are moist.     Pharynx: Oropharynx is clear.  Eyes:     Extraocular Movements: Extraocular movements intact.     Right eye: Normal extraocular motion and no nystagmus.     Left eye: Normal extraocular motion and no nystagmus.     Conjunctiva/sclera: Conjunctivae normal.     Pupils: Pupils are equal, round, and reactive to light.  Cardiovascular:     Rate and Rhythm: Regular rhythm. Bradycardia present.     Pulses: Normal pulses.     Heart sounds: Normal heart sounds.  Pulmonary:     Effort:  Pulmonary effort is normal.     Breath sounds: Normal breath sounds.  Abdominal:     General: Bowel sounds are normal.     Palpations: Abdomen is soft.  Genitourinary:    Comments: Deferred using shared decision making Musculoskeletal:        General: Normal range of motion.     Cervical back: Normal range of motion and neck supple.  Skin:    General: Skin is warm and dry.     Capillary Refill: Capillary refill takes less than 2 seconds.  Neurological:     General: No focal deficit present.     Mental Status: He is alert. Mental status is at baseline.  Psychiatric:        Mood and Affect: Mood normal.        Speech: Speech normal.        Behavior: Behavior normal.        Thought Content: Thought content normal.        Cognition and Memory: Cognition and memory normal.        Judgment: Judgment normal.      No results found for any visits on 02/23/23.      Assessment & Plan:    Routine Health Maintenance and Physical Exam   There is no immunization history on file for this patient.  Health Maintenance  Topic Date Due   HPV VACCINES (1 - Male 3-dose series) Never done   COVID-19 Vaccine (1 - 2024-25 season) 03/11/2023 (Originally 10/26/2022)   INFLUENZA VACCINE  05/25/2023 (Originally 09/25/2022)   Hepatitis C Screening  02/23/2024 (Originally 11/29/2014)   HIV Screening  02/23/2024 (Originally 11/29/2011)   DTaP/Tdap/Td  Discontinued    Discussed health benefits of physical activity, and encouraged him to engage in regular exercise appropriate for his age and condition.  Problem List Items Addressed This Visit     Physical exam, annual - Primary   Today your medical history was reviewed and routine physical exam with labs was performed. Recommend 150 minutes of moderate intensity exercise weekly and consuming a well-balanced diet. Advised to stop smoking if a smoker, avoid smoking if a non-smoker, limit alcohol consumption to 1 drink per day for women and 2 drinks per day  for men, and avoid illicit drug use. Counseled on safe sex practices and offered STI testing today. Counseled on the importance of sunscreen use. Counseled in mental health awareness and when to seek medical care. Vaccine maintenance  discussed. Appropriate health maintenance items reviewed. Return to office in 1 year for annual physical exam.       Relevant Orders   CBC with Differential/Platelet   COMPLETE METABOLIC PANEL WITH GFR   LDL Cholesterol, Direct   Return in about 1 year (around 02/23/2024) for annual physical with labs 1 week prior.     Park Meo, FNP

## 2023-02-23 NOTE — Assessment & Plan Note (Signed)

## 2023-02-24 LAB — CBC WITH DIFFERENTIAL/PLATELET
Absolute Lymphocytes: 2280 {cells}/uL (ref 850–3900)
Absolute Monocytes: 790 {cells}/uL (ref 200–950)
Basophils Absolute: 23 {cells}/uL (ref 0–200)
Basophils Relative: 0.3 %
Eosinophils Absolute: 99 {cells}/uL (ref 15–500)
Eosinophils Relative: 1.3 %
HCT: 45.5 % (ref 38.5–50.0)
Hemoglobin: 15.3 g/dL (ref 13.2–17.1)
MCH: 31.5 pg (ref 27.0–33.0)
MCHC: 33.6 g/dL (ref 32.0–36.0)
MCV: 93.6 fL (ref 80.0–100.0)
MPV: 10.2 fL (ref 7.5–12.5)
Monocytes Relative: 10.4 %
Neutro Abs: 4408 {cells}/uL (ref 1500–7800)
Neutrophils Relative %: 58 %
Platelets: 261 10*3/uL (ref 140–400)
RBC: 4.86 10*6/uL (ref 4.20–5.80)
RDW: 11.9 % (ref 11.0–15.0)
Total Lymphocyte: 30 %
WBC: 7.6 10*3/uL (ref 3.8–10.8)

## 2023-02-24 LAB — COMPLETE METABOLIC PANEL WITH GFR
AG Ratio: 1.7 (calc) (ref 1.0–2.5)
ALT: 13 U/L (ref 9–46)
AST: 13 U/L (ref 10–40)
Albumin: 4.4 g/dL (ref 3.6–5.1)
Alkaline phosphatase (APISO): 160 U/L — ABNORMAL HIGH (ref 36–130)
BUN: 12 mg/dL (ref 7–25)
CO2: 27 mmol/L (ref 20–32)
Calcium: 9.2 mg/dL (ref 8.6–10.3)
Chloride: 103 mmol/L (ref 98–110)
Creat: 1.24 mg/dL (ref 0.60–1.24)
Globulin: 2.6 g/dL (ref 1.9–3.7)
Glucose, Bld: 87 mg/dL (ref 65–99)
Potassium: 4.4 mmol/L (ref 3.5–5.3)
Sodium: 141 mmol/L (ref 135–146)
Total Bilirubin: 0.6 mg/dL (ref 0.2–1.2)
Total Protein: 7 g/dL (ref 6.1–8.1)
eGFR: 82 mL/min/{1.73_m2} (ref >=60)

## 2023-02-24 LAB — LDL CHOLESTEROL, DIRECT: Direct LDL: 67 mg/dL (ref <100)

## 2023-03-02 ENCOUNTER — Other Ambulatory Visit: Payer: Self-pay | Admitting: Family Medicine

## 2023-03-02 DIAGNOSIS — R748 Abnormal levels of other serum enzymes: Secondary | ICD-10-CM

## 2023-03-05 ENCOUNTER — Other Ambulatory Visit: Payer: Self-pay | Admitting: Family Medicine

## 2023-03-05 ENCOUNTER — Other Ambulatory Visit: Payer: BC Managed Care – PPO

## 2023-03-05 DIAGNOSIS — R748 Abnormal levels of other serum enzymes: Secondary | ICD-10-CM

## 2023-03-05 DIAGNOSIS — E559 Vitamin D deficiency, unspecified: Secondary | ICD-10-CM | POA: Diagnosis not present

## 2023-03-17 ENCOUNTER — Other Ambulatory Visit: Payer: Self-pay | Admitting: Family Medicine

## 2023-03-17 DIAGNOSIS — R748 Abnormal levels of other serum enzymes: Secondary | ICD-10-CM

## 2023-03-17 DIAGNOSIS — E559 Vitamin D deficiency, unspecified: Secondary | ICD-10-CM

## 2023-03-18 LAB — ALKALINE PHOSPHATASE ISOENZYMES
Alkaline phosphatase (APISO): 137 U/L — ABNORMAL HIGH (ref 36–130)
Bone Isoenzymes: 75 % — ABNORMAL HIGH (ref 28–66)
Intestinal Isoenzymes: 0 % — ABNORMAL LOW (ref 1–24)
Liver Isoenzymes: 25 % (ref 25–69)
Macrohepatic isoenzymes: 0 % (ref <=0)
Placental isoenzymes: 0 % (ref <=0)

## 2023-03-18 LAB — TEST AUTHORIZATION

## 2023-03-18 LAB — VITAMIN D 25 HYDROXY (VIT D DEFICIENCY, FRACTURES): Vit D, 25-Hydroxy: 16 ng/mL — ABNORMAL LOW (ref 30–100)

## 2023-03-18 LAB — ALKALINE PHOSPHATASE: Alkaline phosphatase (APISO): 137 U/L — ABNORMAL HIGH (ref 36–130)

## 2023-03-18 LAB — GAMMA GT: GGT: 11 U/L (ref 3–70)

## 2023-06-15 ENCOUNTER — Ambulatory Visit: Payer: BC Managed Care – PPO | Admitting: Family Medicine

## 2024-02-23 ENCOUNTER — Encounter: Payer: Self-pay | Admitting: Family Medicine

## 2024-02-23 ENCOUNTER — Ambulatory Visit: Admitting: Family Medicine

## 2024-02-23 VITALS — BP 122/70 | HR 58 | Ht 71.0 in | Wt 164.8 lb

## 2024-02-23 DIAGNOSIS — E559 Vitamin D deficiency, unspecified: Secondary | ICD-10-CM | POA: Insufficient documentation

## 2024-02-23 DIAGNOSIS — Z0000 Encounter for general adult medical examination without abnormal findings: Secondary | ICD-10-CM

## 2024-02-23 DIAGNOSIS — Z0001 Encounter for general adult medical examination with abnormal findings: Secondary | ICD-10-CM

## 2024-02-23 NOTE — Progress Notes (Signed)
 "  Complete physical exam  Patient: Jared Sims    DOB: 06-May-1995 28 y.o.   MRN: [id number]  Chief Complaint  Patient presents with   Medical Management of Chronic Issues    Subjective:    Jared Sims is a 28 y.o. male who presents today for a complete physical exam. He reports consuming a general diet.  He generally feels well. He reports sleeping well. He does not have additional problems to discuss today.   PMH includes vitamin D  deficiency.  Discussed the use of AI scribe software for clinical note transcription with the patient, who gave verbal consent to proceed.  History of Present Illness [name] is a 28 year old male who presents for a routine follow-up and lab recheck.  He has no new medical problems in the past year and maintains a healthy lifestyle, including regular exercise and a balanced diet. He has been fasting today, consuming only water, in preparation for lab work.  He previously took vitamin D  supplements inconsistently, and his last lab results indicated very low vitamin D  levels. He does not regularly take any medications or supplements, except for occasional vitamin C when feeling unwell. 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  He experiences occasional irritability at work but has no significant mood issues, depression, or anxiety. He does not smoke and has no history of asthma since childhood, reporting no need for an inhaler or experiencing shortness of breath.  He has no vision or hearing problems, although he works in a loud environment where ear protection is not always feasible. He has no dental issues. He has no gastrointestinal issues, such as nausea, vomiting, diarrhea, or constipation, and no skin concerns like moles or rashes.   Most recent fall risk assessment:    02/23/2024   12:14 PM  Fall Risk   Falls in the past year? 0  Number falls in past yr: 0  Injury with Fall? 0  Risk for  fall due to : No Fall Risks  Follow up Falls evaluation completed     Most recent depression screenings:    02/23/2024   12:14 PM 02/23/2023   10:45 AM  PHQ 2/9 Scores  PHQ - 2 Score 0 0  PHQ- 9 Score 0 0      Data saved with a previous flowsheet row definition    Vision:Not within last year , Dental: No current dental problems and Receives regular dental care, and STD: The patient denies history of sexually transmitted disease. Patient Active Problem List   Diagnosis Date Noted   Vitamin D  deficiency 02/23/2024   Physical exam, annual 02/19/2022   Past Medical History:  Diagnosis Date   Childhood asthma    Past Surgical History:  Procedure Laterality Date   NO PAST SURGERIES     Social History[1] Family History  Problem Relation Age of Onset   Cancer Maternal Grandmother    Early death Maternal Grandmother    Asthma Maternal Grandfather    Kidney disease Paternal Grandmother    Early death Paternal Grandfather    Allergies[2]    Patient Care Team: Kayla Jeoffrey RAMAN, FNP as PCP - General (Family Medicine)   Review of Systems  Constitutional: Negative.   HENT: Negative.    Eyes: Negative.   Respiratory: Negative.    Cardiovascular: Negative.   Gastrointestinal: Negative.   Genitourinary: Negative.   Musculoskeletal: Negative.   Skin: Negative.   Neurological: Negative.  Endo/Heme/Allergies: Negative.   Psychiatric/Behavioral: Negative.    All other systems reviewed and are negative.     Objective:    BP 122/70   Pulse (!) 58   Ht 5' 11 (1.803 m)   Wt 164 lb 12.8 oz (74.8 kg)   SpO2 99%   BMI 22.98 kg/m  BP Readings from Last 3 Encounters:  02/23/24 122/70  02/23/23 120/62  07/12/22 132/82   Wt Readings from Last 3 Encounters:  02/23/24 164 lb 12.8 oz (74.8 kg)  02/23/23 159 lb (72.1 kg)  02/19/22 139 lb (63 kg)      Physical Exam Vitals and nursing note reviewed.  Constitutional:      Appearance: Normal appearance. He is normal  weight.  HENT:     Head: Normocephalic and atraumatic.     Right Ear: Tympanic membrane, ear canal and external ear normal.     Left Ear: Tympanic membrane, ear canal and external ear normal.     Nose: Nose normal.     Mouth/Throat:     Mouth: Mucous membranes are moist.     Pharynx: Oropharynx is clear.  Eyes:     Extraocular Movements: Extraocular movements intact.     Right eye: Normal extraocular motion and no nystagmus.     Left eye: Normal extraocular motion and no nystagmus.     Conjunctiva/sclera: Conjunctivae normal.     Pupils: Pupils are equal, round, and reactive to light.  Cardiovascular:     Rate and Rhythm: Normal rate and regular rhythm.     Pulses: Normal pulses.     Heart sounds: Normal heart sounds.  Pulmonary:     Effort: Pulmonary effort is normal.     Breath sounds: Normal breath sounds.  Abdominal:     General: Bowel sounds are normal.     Palpations: Abdomen is soft.  Genitourinary:    Comments: Deferred using shared decision making Musculoskeletal:        General: Normal range of motion.     Cervical back: Normal range of motion and neck supple.  Skin:    General: Skin is warm and dry.     Capillary Refill: Capillary refill takes less than 2 seconds.  Neurological:     General: No focal deficit present.     Mental Status: He is alert. Mental status is at baseline.  Psychiatric:        Mood and Affect: Mood normal.        Speech: Speech normal.        Behavior: Behavior normal.        Thought Content: Thought content normal.        Cognition and Memory: Cognition and memory normal.        Judgment: Judgment normal.       No results found for any visits on 02/23/24.      Assessment & Plan:    Routine Health Maintenance and Physical Exam  There is no immunization history on file for this patient.  Health Maintenance  Topic Date Due   Hepatitis C Screening  02/23/2024 (Originally 11/29/2014)   HIV Screening  02/23/2024 (Originally  11/29/2011)   COVID-19 Vaccine (1 - 2025-26 season) 03/10/2024 (Originally 10/26/2023)   Influenza Vaccine  05/24/2024 (Originally 09/25/2023)   Hepatitis B Vaccines 19-59 Average Risk (1 of 3 - 19+ 3-dose series) 02/22/2025 (Originally 11/29/2015)   HPV VACCINES (1 - 3-dose SCDM series) 02/22/2025 (Originally 11/29/2023)   Pneumococcal Vaccine  Aged Out   Meningococcal  B Vaccine  Aged Out   DTaP/Tdap/Td  Discontinued    Discussed health benefits of physical activity, and encouraged him to engage in regular exercise appropriate for his age and condition.  Problem List Items Addressed This Visit       Other   Physical exam, annual - Primary   Relevant Orders   CBC with Differential/Platelet   Comprehensive metabolic panel with GFR   Lipid panel   VITAMIN D  25 Hydroxy (Vit-D Deficiency, Fractures)   Vitamin D  deficiency   Relevant Orders   VITAMIN D  25 Hydroxy (Vit-D Deficiency, Fractures)    Assessment and Plan Assessment & Plan Vitamin D  deficiency Low vitamin D  levels due to irregular supplementation and limited sun exposure. Potential impact on bone health. - Ordered vitamin D  level test. - Recommended vitamin D  supplementation. - Advised increasing sun exposure. - Suggested dietary sources of vitamin D .  General Health Maintenance Discussed vaccinations. He declined HPV vaccine due to low perceived risk. - Recommend 150 minutes of moderate intensity exercise weekly and consuming a well-balanced diet. Vaccine maintenance discussed. Appropriate health maintenance items reviewed.     Return in about 1 year (around 02/22/2025) for annual physical with labs 1 week prior.    Jeoffrey GORMAN Barrio, FNP [facility]       [1]  Social History Tobacco Use   Smoking status: Never   Smokeless tobacco: Never  Vaping Use   Vaping status: Never Used  Substance Use Topics   Alcohol use: Never   Drug use: Never  [2]  Allergies Allergen Reactions    Peanut-Containing Drug Products    "

## 2024-02-24 LAB — COMPREHENSIVE METABOLIC PANEL WITH GFR
AG Ratio: 2 (calc) (ref 1.0–2.5)
ALT: 11 U/L (ref 9–46)
AST: 11 U/L (ref 10–40)
Albumin: 4.5 g/dL (ref 3.6–5.1)
Alkaline phosphatase (APISO): 113 U/L (ref 36–130)
BUN/Creatinine Ratio: 11 (calc) (ref 6–22)
BUN: 15 mg/dL (ref 7–25)
CO2: 31 mmol/L (ref 20–32)
Calcium: 9.1 mg/dL (ref 8.6–10.3)
Chloride: 104 mmol/L (ref 98–110)
Creat: 1.38 mg/dL — ABNORMAL HIGH (ref 0.60–1.24)
Globulin: 2.3 g/dL (ref 1.9–3.7)
Glucose, Bld: 75 mg/dL (ref 65–99)
Potassium: 4.6 mmol/L (ref 3.5–5.3)
Sodium: 141 mmol/L (ref 135–146)
Total Bilirubin: 0.5 mg/dL (ref 0.2–1.2)
Total Protein: 6.8 g/dL (ref 6.1–8.1)
eGFR: 72 mL/min/1.73m2

## 2024-02-24 LAB — CBC WITH DIFFERENTIAL/PLATELET
Absolute Lymphocytes: 2251 {cells}/uL (ref 850–3900)
Absolute Monocytes: 639 {cells}/uL (ref 200–950)
Basophils Absolute: 28 {cells}/uL (ref 0–200)
Basophils Relative: 0.4 %
Eosinophils Absolute: 99 {cells}/uL (ref 15–500)
Eosinophils Relative: 1.4 %
HCT: 45.4 % (ref 39.4–51.1)
Hemoglobin: 15.4 g/dL (ref 13.2–17.1)
MCH: 31.2 pg (ref 27.0–33.0)
MCHC: 33.9 g/dL (ref 31.6–35.4)
MCV: 91.9 fL (ref 81.4–101.7)
MPV: 10 fL (ref 7.5–12.5)
Monocytes Relative: 9 %
Neutro Abs: 4083 {cells}/uL (ref 1500–7800)
Neutrophils Relative %: 57.5 %
Platelets: 264 Thousand/uL (ref 140–400)
RBC: 4.94 Million/uL (ref 4.20–5.80)
RDW: 12.1 % (ref 11.0–15.0)
Total Lymphocyte: 31.7 %
WBC: 7.1 Thousand/uL (ref 3.8–10.8)

## 2024-02-24 LAB — LIPID PANEL
Cholesterol: 116 mg/dL
HDL: 43 mg/dL
LDL Cholesterol (Calc): 58 mg/dL
Non-HDL Cholesterol (Calc): 73 mg/dL
Total CHOL/HDL Ratio: 2.7 (calc)
Triglycerides: 72 mg/dL

## 2024-02-24 LAB — VITAMIN D 25 HYDROXY (VIT D DEFICIENCY, FRACTURES): Vit D, 25-Hydroxy: 38 ng/mL (ref 30–100)

## 2024-03-01 ENCOUNTER — Ambulatory Visit: Payer: Self-pay | Admitting: Family Medicine

## 2024-03-02 ENCOUNTER — Encounter: Admitting: Family Medicine

## 2025-02-14 ENCOUNTER — Other Ambulatory Visit

## 2025-02-23 ENCOUNTER — Encounter: Admitting: Family Medicine
